# Patient Record
Sex: Male | Born: 2000 | Race: White | Hispanic: No | Marital: Single
Health system: Southern US, Community
[De-identification: ages and names within clinical notes are randomized; demographics above are authoritative.]

---
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
## 2020-11-15 DIAGNOSIS — M9903 Segmental and somatic dysfunction of lumbar region: Secondary | ICD-10-CM | POA: Diagnosis not present

## 2020-11-15 DIAGNOSIS — M9902 Segmental and somatic dysfunction of thoracic region: Secondary | ICD-10-CM | POA: Diagnosis not present

## 2020-11-15 DIAGNOSIS — M9904 Segmental and somatic dysfunction of sacral region: Secondary | ICD-10-CM | POA: Diagnosis not present

## 2020-11-15 DIAGNOSIS — M9901 Segmental and somatic dysfunction of cervical region: Secondary | ICD-10-CM | POA: Diagnosis not present

## 2020-11-18 DIAGNOSIS — M9903 Segmental and somatic dysfunction of lumbar region: Secondary | ICD-10-CM | POA: Diagnosis not present

## 2020-11-18 DIAGNOSIS — M9901 Segmental and somatic dysfunction of cervical region: Secondary | ICD-10-CM | POA: Diagnosis not present

## 2020-11-18 DIAGNOSIS — M9904 Segmental and somatic dysfunction of sacral region: Secondary | ICD-10-CM | POA: Diagnosis not present

## 2020-11-18 DIAGNOSIS — M9902 Segmental and somatic dysfunction of thoracic region: Secondary | ICD-10-CM | POA: Diagnosis not present

## 2021-03-13 DIAGNOSIS — J209 Acute bronchitis, unspecified: Secondary | ICD-10-CM | POA: Diagnosis not present

## 2021-03-13 DIAGNOSIS — J0100 Acute maxillary sinusitis, unspecified: Secondary | ICD-10-CM | POA: Diagnosis not present

## 2022-03-02 DIAGNOSIS — M9901 Segmental and somatic dysfunction of cervical region: Secondary | ICD-10-CM | POA: Diagnosis not present

## 2022-03-02 DIAGNOSIS — M9903 Segmental and somatic dysfunction of lumbar region: Secondary | ICD-10-CM | POA: Diagnosis not present

## 2022-03-02 DIAGNOSIS — M9902 Segmental and somatic dysfunction of thoracic region: Secondary | ICD-10-CM | POA: Diagnosis not present

## 2022-03-02 DIAGNOSIS — M9904 Segmental and somatic dysfunction of sacral region: Secondary | ICD-10-CM | POA: Diagnosis not present

## 2022-03-04 DIAGNOSIS — M9901 Segmental and somatic dysfunction of cervical region: Secondary | ICD-10-CM | POA: Diagnosis not present

## 2022-03-04 DIAGNOSIS — M9902 Segmental and somatic dysfunction of thoracic region: Secondary | ICD-10-CM | POA: Diagnosis not present

## 2022-03-04 DIAGNOSIS — M9904 Segmental and somatic dysfunction of sacral region: Secondary | ICD-10-CM | POA: Diagnosis not present

## 2022-03-04 DIAGNOSIS — M9903 Segmental and somatic dysfunction of lumbar region: Secondary | ICD-10-CM | POA: Diagnosis not present

## 2022-03-17 DIAGNOSIS — Z0000 Encounter for general adult medical examination without abnormal findings: Secondary | ICD-10-CM | POA: Diagnosis not present

## 2022-03-17 DIAGNOSIS — Z23 Encounter for immunization: Secondary | ICD-10-CM | POA: Diagnosis not present

## 2022-03-17 DIAGNOSIS — Z6829 Body mass index (BMI) 29.0-29.9, adult: Secondary | ICD-10-CM | POA: Diagnosis not present

## 2022-03-17 DIAGNOSIS — Z1331 Encounter for screening for depression: Secondary | ICD-10-CM | POA: Diagnosis not present

## 2022-04-08 DIAGNOSIS — L03032 Cellulitis of left toe: Secondary | ICD-10-CM | POA: Diagnosis not present

## 2022-04-16 DIAGNOSIS — L03032 Cellulitis of left toe: Secondary | ICD-10-CM | POA: Diagnosis not present

## 2022-10-13 DIAGNOSIS — Z6829 Body mass index (BMI) 29.0-29.9, adult: Secondary | ICD-10-CM | POA: Diagnosis not present

## 2022-10-13 DIAGNOSIS — M778 Other enthesopathies, not elsewhere classified: Secondary | ICD-10-CM | POA: Diagnosis not present

## 2022-10-15 DIAGNOSIS — M25531 Pain in right wrist: Secondary | ICD-10-CM | POA: Diagnosis not present

## 2022-11-02 DIAGNOSIS — M25531 Pain in right wrist: Secondary | ICD-10-CM | POA: Diagnosis not present

## 2022-11-02 DIAGNOSIS — S63591A Other specified sprain of right wrist, initial encounter: Secondary | ICD-10-CM | POA: Diagnosis not present

## 2022-11-04 DIAGNOSIS — S63591A Other specified sprain of right wrist, initial encounter: Secondary | ICD-10-CM | POA: Diagnosis not present

## 2022-11-10 DIAGNOSIS — M25531 Pain in right wrist: Secondary | ICD-10-CM | POA: Diagnosis not present

## 2022-11-13 DIAGNOSIS — S63591A Other specified sprain of right wrist, initial encounter: Secondary | ICD-10-CM | POA: Diagnosis not present

## 2022-11-13 DIAGNOSIS — M24131 Other articular cartilage disorders, right wrist: Secondary | ICD-10-CM | POA: Diagnosis not present
